# Patient Record
Sex: Male | Born: 1995 | Race: Black or African American | Hispanic: No | Marital: Single | Smoking: Never smoker
Health system: Southern US, Community
[De-identification: ages and names within clinical notes are randomized; demographics above are authoritative.]

## PROBLEM LIST (undated history)

## (undated) DIAGNOSIS — J45909 Unspecified asthma, uncomplicated: Secondary | ICD-10-CM

## (undated) DIAGNOSIS — R569 Unspecified convulsions: Secondary | ICD-10-CM

## (undated) HISTORY — PX: TONSILLECTOMY: SUR1361

## (undated) HISTORY — PX: APPENDECTOMY: SHX54

---
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
## 2015-04-09 ENCOUNTER — Emergency Department (HOSPITAL_BASED_OUTPATIENT_CLINIC_OR_DEPARTMENT_OTHER)
Admission: EM | Admit: 2015-04-09 | Discharge: 2015-04-09 | Disposition: A | Discharge status: Home / Self Care | Payer: Medicaid Other | Attending: Emergency Medicine | Admitting: Emergency Medicine

## 2015-04-09 ENCOUNTER — Encounter (HOSPITAL_BASED_OUTPATIENT_CLINIC_OR_DEPARTMENT_OTHER): Payer: Self-pay | Admitting: Emergency Medicine

## 2015-04-09 DIAGNOSIS — N489 Disorder of penis, unspecified: Secondary | ICD-10-CM

## 2015-04-09 DIAGNOSIS — N509 Disorder of male genital organs, unspecified: Secondary | ICD-10-CM | POA: Insufficient documentation

## 2015-04-09 DIAGNOSIS — N4821 Abscess of corpus cavernosum and penis: Secondary | ICD-10-CM | POA: Insufficient documentation

## 2015-04-09 DIAGNOSIS — L0291 Cutaneous abscess, unspecified: Secondary | ICD-10-CM

## 2015-04-09 DIAGNOSIS — J45909 Unspecified asthma, uncomplicated: Secondary | ICD-10-CM | POA: Insufficient documentation

## 2015-04-09 DIAGNOSIS — L02211 Cutaneous abscess of abdominal wall: Secondary | ICD-10-CM | POA: Insufficient documentation

## 2015-04-09 LAB — CBG MONITORING, ED: GLUCOSE-CAPILLARY: 67 mg/dL (ref 65–99)

## 2015-04-09 MED ORDER — SULFAMETHOXAZOLE-TRIMETHOPRIM 800-160 MG PO TABS: 1.0000 | ORAL_TABLET | Freq: Two times a day (BID) | ORAL | Status: AC

## 2015-04-09 NOTE — Discharge Instructions (Signed)
Abscess An abscess is an infected area that contains a collection of pus and debris.It can occur in almost any part of the body. An abscess is also known as a furuncle or boil. CAUSES  An abscess occurs when tissue gets infected. This can occur from blockage of oil or sweat glands, infection of hair follicles, or a minor injury to the skin. As the body tries to fight the infection, pus collects in the area and creates pressure under the skin. This pressure causes pain. People with weakened immune systems have difficulty fighting infections and get certain abscesses more often.  SYMPTOMS Usually an abscess develops on the skin and becomes a painful mass that is red, warm, and tender. If the abscess forms under the skin, you may feel a moveable soft area under the skin. Some abscesses break open (rupture) on their own, but most will continue to get worse without care. The infection can spread deeper into the body and eventually into the bloodstream, causing you to feel ill.  DIAGNOSIS  Your caregiver will take your medical history and perform a physical exam. A sample of fluid may also be taken from the abscess to determine what is causing your infection. TREATMENT  Your caregiver may prescribe antibiotic medicines to fight the infection. However, taking antibiotics alone usually does not cure an abscess. Your caregiver may need to make a small cut (incision) in the abscess to drain the pus. In some cases, gauze is packed into the abscess to reduce pain and to continue draining the area. HOME CARE INSTRUCTIONS   Only take over-the-counter or prescription medicines for pain, discomfort, or fever as directed by your caregiver.  If you were prescribed antibiotics, take them as directed. Finish them even if you start to feel better.  If gauze is used, follow your caregiver's directions for changing the gauze.  To avoid spreading the infection:  Keep your draining abscess covered with a  bandage.  Wash your hands well.  Do not share personal care items, towels, or whirlpools with others.  Avoid skin contact with others.  Keep your skin and clothes clean around the abscess.  Keep all follow-up appointments as directed by your caregiver. SEEK MEDICAL CARE IF:   You have increased pain, swelling, redness, fluid drainage, or bleeding.  You have muscle aches, chills, or a general ill feeling.  You have a fever. MAKE SURE YOU:   Understand these instructions.  Will watch your condition.  Will get help right away if you are not doing well or get worse.   This information is not intended to replace advice given to you by your health care provider. Make sure you discuss any questions you have with your health care provider.   Document Released: 10/02/2004 Document Revised: 06/24/2011 Document Reviewed: 03/07/2011 Elsevier Interactive Patient Education 2016 [employer]. Sexually Transmitted Disease Due to a lesion you have any penis, you were tested for herpes. You may follow up on these results in 3-4 days. No sexual activity until these results have returned. If you have any other symptoms suspicious for sexually transmitted disease, you will need other testing, if your results for herpes type 2 (genital herpes) are positive, you will also need other testing and treatment. Herpes type I is a common infection and does not mean you have a sexually transmitted disease. Herpes type one does not need any specific treatment. A sexually transmitted disease (STD) is a disease or infection that may be passed (transmitted) from person to person, usually  during sexual activity. This may happen by way of saliva, semen, blood, vaginal mucus, or urine. Common STDs include:  Gonorrhea.  Chlamydia.  Syphilis.  HIV and AIDS.  Genital herpes.  Hepatitis B and C.  Trichomonas.  Human papillomavirus (HPV).  Pubic lice.  Scabies.  Mites.  Bacterial vaginosis. WHAT ARE  CAUSES OF STDs? An STD may be caused by bacteria, a virus, or parasites. STDs are often transmitted during sexual activity if one person is infected. However, they may also be transmitted through nonsexual means. STDs may be transmitted after:   Sexual intercourse with an infected person.  Sharing sex toys with an infected person.  Sharing needles with an infected person or using unclean piercing or tattoo needles.  Having intimate contact with the genitals, mouth, or rectal areas of an infected person.  Exposure to infected fluids during birth. WHAT ARE THE SIGNS AND SYMPTOMS OF STDs? Different STDs have different symptoms. Some people may not have any symptoms. If symptoms are present, they may include:  Painful or bloody urination.  Pain in the pelvis, abdomen, vagina, anus, throat, or eyes.  A skin rash, itching, or irritation.  Growths, ulcerations, blisters, or sores in the genital and anal areas.  Abnormal vaginal discharge with or without bad odor.  Penile discharge in men.  Fever.  Pain or bleeding during sexual intercourse.  Swollen glands in the groin area.  Yellow skin and eyes (jaundice). This is seen with hepatitis.  Swollen testicles.  Infertility.  Sores and blisters in the mouth. HOW ARE STDs DIAGNOSED? To make a diagnosis, your health care provider may:  Take a medical history.  Perform a physical exam.  Take a sample of any discharge to examine.  Swab the throat, cervix, opening to the penis, rectum, or vagina for testing.  Test a sample of your first morning urine.  Perform blood tests.  Perform a Pap test, if this applies.  Perform a colposcopy.  Perform a laparoscopy. HOW ARE STDs TREATED? Treatment depends on the STD. Some STDs may be treated but not cured.  Chlamydia, gonorrhea, trichomonas, and syphilis can be cured with antibiotic medicine.  Genital herpes, hepatitis, and HIV can be treated, but not cured, with prescribed  medicines. The medicines lessen symptoms.  Genital warts from HPV can be treated with medicine or by freezing, burning (electrocautery), or surgery. Warts may come back.  HPV cannot be cured with medicine or surgery. However, abnormal areas may be removed from the cervix, vagina, or vulva.  If your diagnosis is confirmed, your recent sexual partners need treatment. This is true even if they are symptom-free or have a negative culture or evaluation. They should not have sex until their health care providers say it is okay.  Your health care provider may test you for infection again 3 months after treatment. HOW CAN I REDUCE MY RISK OF GETTING AN STD? Take these steps to reduce your risk of getting an STD:  Use latex condoms, dental dams, and water-soluble lubricants during sexual activity. Do not use petroleum jelly or oils.  Avoid having multiple sex partners.  Do not have sex with someone who has other sex partners  Do not have sex with anyone you do not know or who is at high risk for an STD.  Avoid risky sex practices that can break your skin.  Do not have sex if you have open sores on your mouth or skin.  Avoid drinking too much alcohol or taking illegal drugs. Alcohol and  drugs can affect your judgment and put you in a vulnerable position.  Avoid engaging in oral and anal sex acts.  Get vaccinated for HPV and hepatitis. If you have not received these vaccines in the past, talk to your health care provider about whether one or both might be right for you.  If you are at risk of being infected with HIV, it is recommended that you take a prescription medicine daily to prevent HIV infection. This is called pre-exposure prophylaxis (PrEP). You are considered at risk if:  You are a man who has sex with other men (MSM).  You are a heterosexual man or woman and are sexually active with more than one partner.  You take drugs by injection.  You are sexually active with a partner who  has HIV.  Talk with your health care provider about whether you are at high risk of being infected with HIV. If you choose to begin PrEP, you should first be tested for HIV. You should then be tested every 3 months for as long as you are taking PrEP. WHAT SHOULD I DO IF I THINK I HAVE AN STD?  See your health care provider.  Tell your sexual partner(s). They should be tested and treated for any STDs.  Do not have sex until your health care provider says it is okay. WHEN SHOULD I GET IMMEDIATE MEDICAL CARE? Contact your health care provider right away if:   You have severe abdominal pain.  You are a man and notice swelling or pain in your testicles.  You are a woman and notice swelling or pain in your vagina.   This information is not intended to replace advice given to you by your health care provider. Make sure you discuss any questions you have with your health care provider.   Document Released: 03/15/2002 Document Revised: 01/13/2014 Document Reviewed: 07/13/2012 Elsevier Interactive Patient Education [employer]2016 Elsevier Inc.

## 2015-04-09 NOTE — ED Notes (Signed)
Patient states that he has 2 areas of swelling and redness. The patient reports that it he tried to pop the one on his chest with a pin yesterday. The patient in no distress at this time.

## 2015-04-09 NOTE — ED Provider Notes (Signed)
CSN: [id number]     Arrival date & time 04/09/15  1603 History  By signing my name below, I, Linus GalasMaharshi Patel, attest that this documentation has been prepared under the direction and in the presence of No att. providers found. Electronically Signed: Linus GalasMaharshi Patel, ED Scribe. 04/10/2015. 12:04 AM.   Chief Complaint  Patient presents with  . Recurrent Skin Infections   The history is provided by the patient. No language interpreter was used.    HPI Comments: Levi Jimenez is a 20 y.o. male who presents to the Emergency Department complaining of abscess that was noted 2 days ago. He localizes one abscess to the left abdomen and the other on his penis. He tried to pop the abscess on his abdomen using a pin. He did not have any drainage. Pt denies any fevers, chills, N/V or any other symptoms at this time. NKDA  Past Medical History  Diagnosis Date  . Asthma   . Seizures [facility](H[facility])    Past Surgical History  Procedure Laterality Date  . Tonsillectomy    . Appendectomy     History reviewed. No pertinent family history. Social History  Substance Use Topics  . Smoking status: Never Smoker   . Smokeless tobacco: None  . Alcohol Use: No    Review of Systems  Constitutional: Negative for fever and chills.  Gastrointestinal: Negative for nausea and vomiting.  All other systems reviewed and are negative.  Allergies  Peanut butter flavor and Soy allergy  Home Medications   Prior to Admission medications   Medication Sig Start Date End Date Taking? Authorizing Provider  acetaminophen (TYLENOL) 325 MG tablet Take by mouth every 6 (six) hours as needed. Patient used this medication for his headache.    Historical Provider, MD  albuterol (PROVENTIL HFA;VENTOLIN HFA) 108 (90 BASE) MCG/ACT inhaler Inhale 2 puffs into the lungs every 6 (six) hours as needed. Patient uses this medication for shortness of breath.    Historical Provider, MD  albuterol (PROVENTIL) (2.5 MG/3ML) 0.083% nebulizer  solution Take 2.5 mg by nebulization every 6 (six) hours as needed.    Historical Provider, MD  sulfamethoxazole-trimethoprim (BACTRIM DS,SEPTRA DS) 800-160 MG tablet Take 1 tablet by mouth 2 (two) times daily. 04/09/15 04/16/15  Arby BarretteMarcy Ashlie Mcmenamy, MD   BP 132/76 mmHg  Pulse 79  Temp(Src) 98.6 F (37 C) (Oral)  Resp 18  Ht 5\' 11"  (1.803 m)  Wt 220 lb (99.791 kg)  BMI 30.70 kg/m2  SpO2 98%   Physical Exam  Constitutional: He is oriented to person, place, and time. He appears well-developed and well-nourished.  HENT:  Head: Normocephalic and atraumatic.  Neck: Normal range of motion. Neck supple.  Cardiovascular: Normal rate, regular rhythm, normal heart sounds and intact distal pulses.  Exam reveals no gallop and no friction rub.   No murmur heard. Pulmonary/Chest: Effort normal and breath sounds normal.  Abdominal: Soft. He exhibits no distension. There is no tenderness.  Musculoskeletal: Normal range of motion. He exhibits no edema or tenderness.  Neurological: He is alert and oriented to person, place, and time.  Skin: Skin is warm and dry.  1.5 cm nodule indurated left abdominal well will central eschar on top of it, pale and yellow; no fluctuance 1 cm induration on penis, eschar on top of it, no fluctuance   Psychiatric: He has a normal mood and affect.  Nursing note and vitals reviewed.   ED Course  Procedures   DIAGNOSTIC STUDIES: Oxygen Saturation is 100% on room air, normal  by my interpretation.    COORDINATION OF CARE: 5:41 PM Will order herpes screen and CBG. Discussed treatment plan with pt and mother at bedside and they agreed to plan.  Labs Review Labs Reviewed  HSV(HERPES SMPLX)ABS-I+II(IGG+IGM)-BLD  CBG MONITORING, ED    Imaging Review No results found. I have personally reviewed and evaluated these images and lab results as part of my medical decision-making.   EKG Interpretation None      MDM   Final diagnoses:  Abscess  Penile lesion   Patient  has a indurated abscess on the abdominal wall. This is uncomplicated in appearance. There is surrounding erythema but no fluctuance. At this time patient will be placed on antibiotics, Bactrim, with recommendations for warm compresses and observation. Signs and symptoms for return are reviewed. Patient also has a penile lesion at the base of the penis that is indurated also with a dry eschar on top. No General edema or erythema of the penis. The lesion is not clearly on the hairbearing area to suggest follicular abscess. Consideration is given to an HSV lesion thus labs have been drawn to differentiate possible HSV lesion from early abscess.   Arby Barrette, MD 04/10/15 [phone]

## 2015-04-10 LAB — HSV(HERPES SMPLX)ABS-I+II(IGG+IGM)-BLD
HSV 1 Glycoprotein G Ab, IgG: 31.7 index — ABNORMAL HIGH (ref 0.00–0.90)
HSVI/II Comb IgM: 1.01 Ratio — ABNORMAL HIGH (ref 0.00–0.90)

## 2015-04-12 ENCOUNTER — Telehealth (HOSPITAL_BASED_OUTPATIENT_CLINIC_OR_DEPARTMENT_OTHER): Payer: Self-pay | Admitting: Emergency Medicine

## 2015-04-12 NOTE — Telephone Encounter (Signed)
Chart handoff to EDP for treatment plan for HSV, patient has been informed

## 2015-04-15 ENCOUNTER — Telehealth: Payer: Self-pay

## 2015-05-13 ENCOUNTER — Telehealth (HOSPITAL_BASED_OUTPATIENT_CLINIC_OR_DEPARTMENT_OTHER): Payer: Self-pay

## 2016-06-06 ENCOUNTER — Emergency Department (HOSPITAL_BASED_OUTPATIENT_CLINIC_OR_DEPARTMENT_OTHER): Payer: Self-pay

## 2016-06-06 ENCOUNTER — Encounter (HOSPITAL_BASED_OUTPATIENT_CLINIC_OR_DEPARTMENT_OTHER): Payer: Self-pay | Admitting: *Deleted

## 2016-06-06 ENCOUNTER — Emergency Department (HOSPITAL_BASED_OUTPATIENT_CLINIC_OR_DEPARTMENT_OTHER)
Admission: EM | Admit: 2016-06-06 | Discharge: 2016-06-06 | Disposition: A | Discharge status: Home / Self Care | Payer: Self-pay | Attending: Emergency Medicine | Admitting: Emergency Medicine

## 2016-06-06 DIAGNOSIS — L0231 Cutaneous abscess of buttock: Secondary | ICD-10-CM | POA: Insufficient documentation

## 2016-06-06 DIAGNOSIS — J45909 Unspecified asthma, uncomplicated: Secondary | ICD-10-CM | POA: Insufficient documentation

## 2016-06-06 DIAGNOSIS — Z79899 Other long term (current) drug therapy: Secondary | ICD-10-CM | POA: Insufficient documentation

## 2016-06-06 MED ORDER — LIDOCAINE-EPINEPHRINE (PF) 2 %-1:200000 IJ SOLN
10.0000 mL | Freq: Once | INTRAMUSCULAR | Status: AC
  Administered 2016-06-06: 10 mL

## 2016-06-06 MED ORDER — IBUPROFEN 200 MG PO TABS
600.0000 mg | ORAL_TABLET | Freq: Once | ORAL | Status: AC
  Administered 2016-06-06: 600 mg via ORAL
  Filled 2016-06-06: qty 1

## 2016-06-06 MED ORDER — SULFAMETHOXAZOLE-TRIMETHOPRIM 800-160 MG PO TABS: 1.0000 | ORAL_TABLET | Freq: Two times a day (BID) | ORAL | 0 refills | Status: AC

## 2016-06-06 MED ORDER — LIDOCAINE-EPINEPHRINE (PF) 2 %-1:200000 IJ SOLN
10.0000 mL | Freq: Once | INTRAMUSCULAR | Status: AC
  Administered 2016-06-06: 10 mL
  Filled 2016-06-06: qty 10

## 2016-06-06 MED ORDER — LIDOCAINE-EPINEPHRINE (PF) 2 %-1:200000 IJ SOLN
INTRAMUSCULAR | Status: AC
  Filled 2016-06-06: qty 10

## 2016-06-06 MED ORDER — NAPROXEN 500 MG PO TABS: 500.0000 mg | ORAL_TABLET | Freq: Two times a day (BID) | ORAL | 0 refills | Status: DC

## 2016-06-06 MED FILL — NAPROXEN 500 MG TABLET: 500 | 7 days supply | Qty: 15 | Fill #0

## 2016-06-06 MED FILL — SULFAMETHOXAZOLE/TMP DS TAB: 800-160 | 7 days supply | Qty: 14 | Fill #0

## 2016-06-06 NOTE — ED Notes (Signed)
ED Provider at bedside. 

## 2016-06-06 NOTE — Discharge Instructions (Signed)
Take antibiotics, soak twice daily.  If you were given medicines take as directed.  If you are on coumadin or contraceptives realize their levels and effectiveness is altered by many different medicines.  If you have any reaction (rash, tongues swelling, other) to the medicines stop taking and see a physician.    If your blood pressure was elevated in the ER make sure you follow up for management with a primary doctor or return for chest pain, shortness of breath or stroke symptoms.  Please follow up as directed and return to the ER or see a physician for new or worsening symptoms.  Thank you. Vitals:   06/06/16 1356 06/06/16 1357  BP:  129/78  Pulse:  64  Resp:  18  Temp:  99.3 F (37.4 C)  TempSrc:  Oral  SpO2:  100%  Weight: 102.1 kg (225 lb)   Height: 5\' 11"  (1.803 m)

## 2016-06-06 NOTE — ED Triage Notes (Signed)
Abscess on right buttock x 1 week.  Hx of same

## 2016-06-06 NOTE — ED Notes (Signed)
I&D tray at bedside.

## 2016-06-06 NOTE — ED Provider Notes (Signed)
[facility] Provider Note   CSN: [id number] Arrival date & time: 06/06/16  1349     History   Chief Complaint Chief Complaint  Patient presents with  . Abscess    HPI Levi Jimenez is a 21 y.o. male.  Patient with history of cutaneous abscess presents with worsening abscess in the right by Dr. for the past week. No fevers or chills. No diabetes history. Patient's had mild drainage from it.      Past Medical History:  Diagnosis Date  . Asthma   . Seizures (HCC)     There are no active problems to display for this patient.   Past Surgical History:  Procedure Laterality Date  . APPENDECTOMY    . TONSILLECTOMY         Home Medications    Prior to Admission medications   Medication Sig Start Date End Date Taking? Authorizing Provider  acetaminophen (TYLENOL) 325 MG tablet Take by mouth every 6 (six) hours as needed. Patient used this medication for his headache.    [provider]  albuterol (PROVENTIL HFA;VENTOLIN HFA) 108 (90 BASE) MCG/ACT inhaler Inhale 2 puffs into the lungs every 6 (six) hours as needed. Patient uses this medication for shortness of breath.    [provider]  albuterol (PROVENTIL) (2.5 MG/3ML) 0.083% nebulizer solution Take 2.5 mg by nebulization every 6 (six) hours as needed.    [provider]  naproxen (NAPROSYN) 500 MG tablet Take 1 tablet (500 mg total) by mouth 2 (two) times daily. 06/06/16   Blane Ohara, MD  sulfamethoxazole-trimethoprim (BACTRIM DS,SEPTRA DS) 800-160 MG tablet Take 1 tablet by mouth 2 (two) times daily. 06/06/16 06/13/16  Blane Ohara, MD    Family History History reviewed. No pertinent family history.  Social History Social History  Substance Use Topics  . Smoking status: Never Smoker  . Smokeless tobacco: Not on file  . Alcohol use No     Allergies   Peanut butter flavor and Soy allergy   Review of Systems Review of Systems  Constitutional: Negative for fever.    Respiratory: Negative for cough.   Skin: Positive for rash and wound.  Neurological: Negative for headaches.     Physical Exam Updated Vital Signs BP 129/78 (BP Location: Left Arm)   Pulse 64   Temp 99.3 F (37.4 C) (Oral)   Resp 18   Ht 5\' 11"  (1.803 m)   Wt 102.1 kg (225 lb)   SpO2 100%   BMI 31.38 kg/m   Physical Exam  Constitutional: He appears well-developed and well-nourished. No distress.  Pulmonary/Chest: Effort normal.  Neurological: He is alert.  Skin: Skin is warm.  Patient has approximately 3 cm x 3 cm induration with central opening and mild purulent drainage to the right mid buttocks. Mild warmth. No crepitus.  Psychiatric: He has a normal mood and affect.  Nursing note and vitals reviewed.    ED Treatments / Results  Labs (all labs ordered are listed, but only abnormal results are displayed) Labs Reviewed - No data to display  EKG  EKG Interpretation None       Radiology No results found.  Procedures Procedures (including critical care time) INCISION AND DRAINAGE Performed by: Enid Skeens Consent: Verbal consent obtained. Risks and benefits: risks, benefits and alternatives were discussed Type: abscess  Body area: buttocks Anesthesia: local infiltration Incision was made with a scalpel. Local anesthetic: lidocaine Anesthetic total: 20 ml Complexity: complex Blunt dissection to break up loculations Drainage:  4 cc  Patient tolerance: Patient tolerated the procedure well with no immediate complications.    Medications Ordered in ED Medications  ibuprofen (ADVIL,MOTRIN) tablet 600 mg (not administered)  lidocaine-EPINEPHrine (XYLOCAINE W/EPI) 2 %-1:200000 (PF) injection 10 mL (10 mLs Infiltration Given 06/06/16 1501)  lidocaine-EPINEPHrine (XYLOCAINE W/EPI) 2 %-1:200000 (PF) injection 10 mL (10 mLs Infiltration Given 06/06/16 1508)     Initial Impression / Assessment and Plan / ED Course  I have reviewed the triage vital signs  and the nursing notes.  Pertinent labs & imaging results that were available during my care of the patient were reviewed by me and considered in my medical decision making (see chart for details).    Well-appearing patient presents with clinically abscess. Incision and drainage performed in the ER. Patient tolerated well. Discussed antibody X and outpatient follow.  Results and differential diagnosis were discussed with the patient/parent/guardian. Xrays were independently reviewed by myself.  Close follow up outpatient was discussed, comfortable with the plan.   Medications  ibuprofen (ADVIL,MOTRIN) tablet 600 mg (not administered)  lidocaine-EPINEPHrine (XYLOCAINE W/EPI) 2 %-1:200000 (PF) injection 10 mL (10 mLs Infiltration Given 06/06/16 1501)  lidocaine-EPINEPHrine (XYLOCAINE W/EPI) 2 %-1:200000 (PF) injection 10 mL (10 mLs Infiltration Given 06/06/16 1508)    Vitals:   06/06/16 1356 06/06/16 1357  BP:  129/78  Pulse:  64  Resp:  18  Temp:  99.3 F (37.4 C)  TempSrc:  Oral  SpO2:  100%  Weight: 102.1 kg (225 lb)   Height: 5\' 11"  (1.803 m)     Final diagnoses:  Cutaneous abscess of buttock     Final Clinical Impressions(s) / ED Diagnoses   Final diagnoses:  Cutaneous abscess of buttock    New Prescriptions New Prescriptions   NAPROXEN (NAPROSYN) 500 MG TABLET    Take 1 tablet (500 mg total) by mouth 2 (two) times daily.   SULFAMETHOXAZOLE-TRIMETHOPRIM (BACTRIM DS,SEPTRA DS) 800-160 MG TABLET    Take 1 tablet by mouth 2 (two) times daily.     Blane OharaZavitz, Cartier Mapel, MD 06/06/16 [phone]

## 2016-06-06 NOTE — ED Notes (Signed)
Pt directed to pharmacy to pick up Rx 

## 2016-06-06 NOTE — ED Notes (Signed)
Patient c/o abscess to right buttocks, reports hx of same. States this abscess has been present x1 week. Patient states last night the area opened on its own., reports green-bloody discharge. Patient requesting the site to be drained. Patient denies taking anything for pain PTA.

## 2017-07-12 ENCOUNTER — Encounter (HOSPITAL_BASED_OUTPATIENT_CLINIC_OR_DEPARTMENT_OTHER): Payer: Self-pay | Admitting: Emergency Medicine

## 2017-07-12 ENCOUNTER — Other Ambulatory Visit: Payer: Self-pay

## 2017-07-12 ENCOUNTER — Emergency Department (HOSPITAL_BASED_OUTPATIENT_CLINIC_OR_DEPARTMENT_OTHER)
Admission: EM | Admit: 2017-07-12 | Discharge: 2017-07-12 | Disposition: A | Discharge status: Home / Self Care | Payer: Self-pay | Attending: Emergency Medicine | Admitting: Emergency Medicine

## 2017-07-12 DIAGNOSIS — J45909 Unspecified asthma, uncomplicated: Secondary | ICD-10-CM | POA: Insufficient documentation

## 2017-07-12 DIAGNOSIS — L02415 Cutaneous abscess of right lower limb: Secondary | ICD-10-CM | POA: Insufficient documentation

## 2017-07-12 DIAGNOSIS — Z79899 Other long term (current) drug therapy: Secondary | ICD-10-CM | POA: Insufficient documentation

## 2017-07-12 DIAGNOSIS — L0291 Cutaneous abscess, unspecified: Secondary | ICD-10-CM

## 2017-07-12 DIAGNOSIS — Z91010 Allergy to peanuts: Secondary | ICD-10-CM | POA: Insufficient documentation

## 2017-07-12 MED ORDER — CLINDAMYCIN HCL 150 MG PO CAPS: 450.0000 mg | ORAL_CAPSULE | Freq: Three times a day (TID) | ORAL | 0 refills | Status: DC

## 2017-07-12 MED ORDER — LIDOCAINE HCL (PF) 1 % IJ SOLN
5.0000 mL | Freq: Once | INTRAMUSCULAR | Status: AC
Start: 2017-07-12 — End: 2017-07-12
  Administered 2017-07-12: 5 mL
  Filled 2017-07-12: qty 5

## 2017-07-12 MED ORDER — CEPHALEXIN 500 MG PO CAPS: 500.0000 mg | ORAL_CAPSULE | Freq: Four times a day (QID) | ORAL | 0 refills | Status: AC

## 2017-07-12 MED ORDER — CLINDAMYCIN HCL 150 MG PO CAPS
450.0000 mg | ORAL_CAPSULE | Freq: Once | ORAL | Status: DC
  Filled 2017-07-12: qty 3

## 2017-07-12 MED ORDER — SULFAMETHOXAZOLE-TRIMETHOPRIM 800-160 MG PO TABS: 1.0000 | ORAL_TABLET | Freq: Two times a day (BID) | ORAL | 0 refills | Status: AC

## 2017-07-12 MED ORDER — NAPROXEN 250 MG PO TABS
500.0000 mg | ORAL_TABLET | Freq: Once | ORAL | Status: DC
  Filled 2017-07-12: qty 2

## 2017-07-12 MED ORDER — NAPROXEN 500 MG PO TABS: 500.0000 mg | ORAL_TABLET | Freq: Two times a day (BID) | ORAL | 0 refills | Status: AC

## 2017-07-12 MED ORDER — PROBIOTIC 250 MG PO CAPS: 1.0000 | ORAL_CAPSULE | Freq: Every day | ORAL | 0 refills | Status: DC

## 2017-07-12 MED ORDER — NAPROXEN 500 MG PO TABS: 500.0000 mg | ORAL_TABLET | Freq: Two times a day (BID) | ORAL | 0 refills | Status: DC

## 2017-07-12 NOTE — ED Triage Notes (Signed)
Patient states that he has had an area of swelling to his right outer upper leg - the patient reports that he thinks he was bit by a bug - patient reports that it hurts when he walks on it  - Patient reports that he has had this for the last "few days"

## 2017-07-12 NOTE — Discharge Instructions (Addendum)
Medications: Bactrim, Keflex, Naprosyn  Treatment: Take Bactrim and Keflex until completed.  Take Naprosyn twice daily as needed for your pain.  Wash wound with warm soapy water and keep clean and covered.  Change dressing daily.  Follow-up: Please return to emergency department in 2 days for wound check.  Please return sooner if you develop any increasing pain, redness, swelling, red streaking from the wound, or fevers.

## 2017-07-12 NOTE — ED Provider Notes (Signed)
[facility] EMERGENCY DEPARTMENT Provider Note   CSN: [id number] Arrival date & time: 07/12/17  1138     History   Chief Complaint Chief Complaint  Patient presents with  . Cellulitis    HPI Levi Jimenez is a 22 y.o. male history of seizures, asthma who presents with a few day history of swelling, pain, and drainage to his right posterior thigh.  He reports he might have been bitten by a spider.  Symptoms are worse with pressure around the wound.  He has mild pain when he walks.  He denies any fevers.  He has had drainage.  He reports history of abscess on his buttock that had to be drained.  He denies any chest pain, shortness of breath, abdominal pain, nausea, vomiting.  HPI  Past Medical History:  Diagnosis Date  . Asthma   . Seizures (HCC)     There are no active problems to display for this patient.   Past Surgical History:  Procedure Laterality Date  . APPENDECTOMY    . TONSILLECTOMY          Home Medications    Prior to Admission medications   Medication Sig Start Date End Date Taking? Authorizing Provider  acetaminophen (TYLENOL) 325 MG tablet Take by mouth every 6 (six) hours as needed. Patient used this medication for his headache.    [provider]  albuterol (PROVENTIL HFA;VENTOLIN HFA) 108 (90 BASE) MCG/ACT inhaler Inhale 2 puffs into the lungs every 6 (six) hours as needed. Patient uses this medication for shortness of breath.    [provider]  albuterol (PROVENTIL) (2.5 MG/3ML) 0.083% nebulizer solution Take 2.5 mg by nebulization every 6 (six) hours as needed.    [provider]  cephALEXin (KEFLEX) 500 MG capsule Take 1 capsule (500 mg total) by mouth 4 (four) times daily. 07/12/17   Shamara Soza, Waylan Boga, PA-C  naproxen (NAPROSYN) 500 MG tablet Take 1 tablet (500 mg total) by mouth 2 (two) times daily. 07/12/17   Datron Brakebill, Waylan Boga, PA-C  sulfamethoxazole-trimethoprim (BACTRIM DS,SEPTRA DS) 800-160 MG tablet Take 1  tablet by mouth 2 (two) times daily for 7 days. 07/12/17 07/19/17  Emi Holes, PA-C    Family History History reviewed. No pertinent family history.  Social History Social History   Tobacco Use  . Smoking status: Never Smoker  . Smokeless tobacco: Never Used  Substance Use Topics  . Alcohol use: No  . Drug use: No     Allergies   Peanut butter flavor and Soy allergy   Review of Systems Review of Systems  Constitutional: Negative for chills and fever.  HENT: Negative for facial swelling.   Respiratory: Negative for shortness of breath.   Cardiovascular: Negative for chest pain.  Gastrointestinal: Negative for abdominal pain, nausea and vomiting.  Skin: Positive for wound. Negative for rash.  Psychiatric/Behavioral: The patient is not nervous/anxious.      Physical Exam Updated Vital Signs BP 128/79 (BP Location: Left Arm)   Pulse 66   Temp 98.9 F (37.2 C) (Oral)   Resp 18   Ht 5\' 10"  (1.778 m)   Wt 99.8 kg (220 lb)   SpO2 99%   BMI 31.57 kg/m   Physical Exam  Constitutional: He appears well-developed and well-nourished. No distress.  HENT:  Head: Normocephalic and atraumatic.  Mouth/Throat: Oropharynx is clear and moist. No oropharyngeal exudate.  Eyes: Pupils are equal, round, and reactive to light. Conjunctivae are normal. Right eye exhibits no discharge. Left  eye exhibits no discharge. No scleral icterus.  Neck: Normal range of motion. Neck supple. No thyromegaly present.  Cardiovascular: Normal rate, regular rhythm, normal heart sounds and intact distal pulses. Exam reveals no gallop and no friction rub.  No murmur heard. Pulmonary/Chest: Effort normal and breath sounds normal. No stridor. No respiratory distress. He has no wheezes. He has no rales.  Abdominal: Soft. Bowel sounds are normal. He exhibits no distension. There is no tenderness. There is no rebound and no guarding.  Musculoskeletal: He exhibits no edema.  Lymphadenopathy:    He has no  cervical adenopathy.  Neurological: He is alert. Coordination normal.  Skin: Skin is warm and dry. No rash noted. He is not diaphoretic. No pallor.  Abscess to right posterior thigh with few small openings with purulent bloody drainage, 8 to 9 cm circumferential area of surrounding induration and tenderness, mild erythema  Psychiatric: He has a normal mood and affect.  Nursing note and vitals reviewed.    ED Treatments / Results  Labs (all labs ordered are listed, but only abnormal results are displayed) Labs Reviewed - No data to display  EKG None  Radiology No results found.  Procedures .Marland Kitchen.Incision and Drainage Date/Time: 07/12/2017 3:19 PM Performed by: Emi HolesLaw, Raiya Stainback M, PA-C Authorized by: Emi HolesLaw, Marcellius Montagna M, PA-C   Consent:    Consent obtained:  Verbal   Consent given by:  Patient   Risks discussed:  Bleeding, incomplete drainage, pain and infection Location:    Type:  Abscess   Size:  8-9 cm induration, unable to assess cavity size with no access to US today   Location:  Lower extremity   Lower extremity location:  Leg   Leg location:  R upper leg Pre-procedure details:    Skin preparation:  Betadine Anesthesia (see MAR for exact dosages):    Anesthesia method:  Local infiltration   Local anesthetic:  Lidocaine 1% w/o epi Procedure type:    Complexity:  Simple Procedure details:    Needle aspiration: no     Incision types:  Single straight   Incision depth:  Dermal   Scalpel blade:  11   Wound management:  Probed and deloculated, irrigated with saline and extensive cleaning   Drainage:  Bloody and purulent   Drainage amount:  Copious   Wound treatment:  Wound left open   Packing materials:  None Post-procedure details:    Patient tolerance of procedure:  Tolerated well, no immediate complications   (including critical care time)  Medications Ordered in ED Medications  lidocaine (PF) (XYLOCAINE) 1 % injection 5 mL (5 mLs Infiltration Given 07/12/17 1250)      Initial Impression / Assessment and Plan / ED Course  I have reviewed the triage vital signs and the nursing notes.  Pertinent labs & imaging results that were available during my care of the patient were reviewed by me and considered in my medical decision making (see chart for details).     Patient with skin abscess. Incision and drainage performed in the ED today.  I extended the already draining wound to allow more complete drainage.  Abscess was not large enough to warrant packing or drain placement. Wound recheck in 2 days. Supportive care and return precautions discussed.  Pt sent home with Bactrim, Keflex, Naprosyn for pain.  Patient to follow-up in 2 days for wound recheck.  Patient understands and agrees with plan.  Patient vitals stable throughout ED course and discharged in satisfactory condition.    Final Clinical  Impressions(s) / ED Diagnoses   Final diagnoses:  Abscess    ED Discharge Orders        Ordered    clindamycin (CLEOCIN) 150 MG capsule  3 times daily,   Status:  Discontinued     07/12/17 1241    naproxen (NAPROSYN) 500 MG tablet  2 times daily,   Status:  Discontinued     07/12/17 1241    Saccharomyces boulardii (PROBIOTIC) 250 MG CAPS  Daily,   Status:  Discontinued     07/12/17 1241    sulfamethoxazole-trimethoprim (BACTRIM DS,SEPTRA DS) 800-160 MG tablet  2 times daily     07/12/17 1346    cephALEXin (KEFLEX) 500 MG capsule  4 times daily     07/12/17 1346    naproxen (NAPROSYN) 500 MG tablet  2 times daily     07/12/17 [address] [place], PA-C 07/12/17 1521    Benjiman Core, MD 07/12/17 1535

## 2020-02-17 ENCOUNTER — Other Ambulatory Visit (HOSPITAL_COMMUNITY): Payer: Self-pay | Admitting: Emergency Medicine

## 2020-02-17 ENCOUNTER — Encounter (HOSPITAL_BASED_OUTPATIENT_CLINIC_OR_DEPARTMENT_OTHER): Payer: Self-pay | Admitting: Emergency Medicine

## 2020-02-17 ENCOUNTER — Emergency Department (HOSPITAL_BASED_OUTPATIENT_CLINIC_OR_DEPARTMENT_OTHER)
Admission: EM | Admit: 2020-02-17 | Discharge: 2020-02-17 | Disposition: A | Discharge status: Home / Self Care | Payer: Self-pay | Attending: Emergency Medicine | Admitting: Emergency Medicine

## 2020-02-17 ENCOUNTER — Other Ambulatory Visit: Payer: Self-pay

## 2020-02-17 DIAGNOSIS — Z202 Contact with and (suspected) exposure to infections with a predominantly sexual mode of transmission: Secondary | ICD-10-CM | POA: Insufficient documentation

## 2020-02-17 DIAGNOSIS — J45909 Unspecified asthma, uncomplicated: Secondary | ICD-10-CM | POA: Insufficient documentation

## 2020-02-17 DIAGNOSIS — R300 Dysuria: Secondary | ICD-10-CM | POA: Insufficient documentation

## 2020-02-17 LAB — URINALYSIS, ROUTINE W REFLEX MICROSCOPIC
Bilirubin Urine: NEGATIVE
Glucose, UA: NEGATIVE mg/dL
Ketones, ur: NEGATIVE mg/dL
Nitrite: NEGATIVE
Protein, ur: NEGATIVE mg/dL
Specific Gravity, Urine: 1.015 (ref 1.005–1.030)
pH: 7.5 (ref 5.0–8.0)

## 2020-02-17 LAB — URINALYSIS, MICROSCOPIC (REFLEX)

## 2020-02-17 LAB — HIV ANTIBODY (ROUTINE TESTING W REFLEX): HIV Screen 4th Generation wRfx: NONREACTIVE

## 2020-02-17 MED ORDER — CEPHALEXIN 500 MG PO CAPS: 500.0000 mg | ORAL_CAPSULE | Freq: Four times a day (QID) | ORAL | 0 refills | Status: AC

## 2020-02-17 MED ORDER — LIDOCAINE HCL (PF) 1 % IJ SOLN
1.0000 mL | Freq: Once | INTRAMUSCULAR | Status: AC
Start: 2020-02-17 — End: 2020-02-17
  Administered 2020-02-17: 1 mL
  Filled 2020-02-17: qty 5

## 2020-02-17 MED ORDER — CEFTRIAXONE SODIUM 500 MG IJ SOLR
500.0000 mg | Freq: Once | INTRAMUSCULAR | Status: AC
  Administered 2020-02-17: 500 mg via INTRAMUSCULAR
  Filled 2020-02-17: qty 500

## 2020-02-17 MED ORDER — DOXYCYCLINE HYCLATE 100 MG PO CAPS
100.0000 mg | ORAL_CAPSULE | Freq: Two times a day (BID) | ORAL | 0 refills | Status: AC
Start: 2020-02-17 — End: 2020-02-24

## 2020-02-17 MED ORDER — METRONIDAZOLE 500 MG PO TABS
2000.0000 mg | ORAL_TABLET | Freq: Once | ORAL | Status: AC
  Administered 2020-02-17: 2000 mg via ORAL
  Filled 2020-02-17: qty 4

## 2020-02-17 MED FILL — CEPHALEXIN 500 MG CAPSULE: 500 | 5 days supply | Qty: 20 | Fill #0

## 2020-02-17 MED FILL — DOXYCYCLINE HYCLATE 100 MG: 100 | 7 days supply | Qty: 14 | Fill #0

## 2020-02-17 NOTE — ED Notes (Signed)
Patient verbalized understanding of dc instructions, prescriptions, follow up referrals and reasons to return to ER for reevaluation.  

## 2020-02-17 NOTE — ED Triage Notes (Signed)
Pt had exposure to stds.  Girlfriend has gonorrhea, syphilis and trichomonas.  Pt having burning urination and drainage since yesterday.

## 2020-02-17 NOTE — ED Provider Notes (Signed)
[facility] EMERGENCY DEPARTMENT Provider Note   CSN: [id number] Arrival date & time: 02/17/20  1156     History Chief Complaint  Patient presents with  . Exposure to STD    Levi Jimenez is a 25 y.o. male.  Presents to ER with concern for STD exposure.  Patient reports that his girlfriend recently notified him that she had gonorrhea syphilis and trichomonas.  Yesterday patient noted having some mild burning with urination as well as occasional penile discharge.  Discharge is slightly yellow.  He denies any other symptoms, no pain, ulcers in his genital region.  Thinks that he had an STD many years ago.  States he only has 1 sexual partner, male, does not use protection.  HPI     Past Medical History:  Diagnosis Date  . Asthma   . Seizures (HCC)     There are no problems to display for this patient.   Past Surgical History:  Procedure Laterality Date  . APPENDECTOMY    . TONSILLECTOMY         No family history on file.  Social History   Tobacco Use  . Smoking status: Never Smoker  . Smokeless tobacco: Never Used  Substance Use Topics  . Alcohol use: No  . Drug use: No    Home Medications Prior to Admission medications   Medication Sig Start Date End Date Taking? Authorizing Provider  cephALEXin (KEFLEX) 500 MG capsule Take 1 capsule (500 mg total) by mouth 4 (four) times daily. 02/17/20  Yes Milagros Loll, MD  doxycycline (VIBRAMYCIN) 100 MG capsule Take 1 capsule (100 mg total) by mouth 2 (two) times daily for 7 days. 02/17/20 02/24/20 Yes Shay Jhaveri, Quitman Livings, MD  acetaminophen (TYLENOL) 325 MG tablet Take by mouth every 6 (six) hours as needed. Patient used this medication for his headache.    [provider]  albuterol (PROVENTIL HFA;VENTOLIN HFA) 108 (90 BASE) MCG/ACT inhaler Inhale 2 puffs into the lungs every 6 (six) hours as needed. Patient uses this medication for shortness of breath.    [provider]  albuterol  (PROVENTIL) (2.5 MG/3ML) 0.083% nebulizer solution Take 2.5 mg by nebulization every 6 (six) hours as needed.    [provider]  cephALEXin (KEFLEX) 500 MG capsule Take 1 capsule (500 mg total) by mouth 4 (four) times daily. 07/12/17   Law, Waylan Boga, PA-C  naproxen (NAPROSYN) 500 MG tablet Take 1 tablet (500 mg total) by mouth 2 (two) times daily. 07/12/17   Law, Waylan Boga, PA-C    Allergies    Peanut butter flavor and Soy allergy  Review of Systems   Review of Systems  Constitutional: Negative for chills and fever.  HENT: Negative for ear pain and sore throat.   Eyes: Negative for pain and visual disturbance.  Respiratory: Negative for cough and shortness of breath.   Cardiovascular: Negative for chest pain and palpitations.  Gastrointestinal: Negative for abdominal pain and vomiting.  Genitourinary: Positive for penile discharge and urgency. Negative for dysuria and hematuria.  Musculoskeletal: Negative for arthralgias and back pain.  Skin: Negative for color change and rash.  Neurological: Negative for seizures and syncope.  All other systems reviewed and are negative.   Physical Exam Updated Vital Signs BP (!) 142/98 (BP Location: Left Arm)   Pulse 79   Temp 98.4 F (36.9 C) (Oral)   Resp 18   Ht 5' 10.5" (1.791 m)   Wt 134 kg   SpO2 100%  BMI 41.79 kg/m   Physical Exam Vitals and nursing note reviewed. Exam conducted with a chaperone present.  Constitutional:      Appearance: He is well-developed and well-nourished.  HENT:     Head: Normocephalic and atraumatic.  Eyes:     Conjunctiva/sclera: Conjunctivae normal.  Cardiovascular:     Rate and Rhythm: Normal rate and regular rhythm.     Heart sounds: No murmur heard.   Pulmonary:     Effort: Pulmonary effort is normal. No respiratory distress.  Genitourinary:    Comments: Curtis RN chaperone  Normal-appearing testicles, penis, no discharge at tip of penis, testicles are nontender, no ulcers noted,  no inguinal lymphadenopathy Musculoskeletal:        General: No edema.     Cervical back: Neck supple.  Skin:    General: Skin is warm and dry.  Neurological:     Mental Status: He is alert.  Psychiatric:        Mood and Affect: Mood and affect and mood normal.        Behavior: Behavior normal.     ED Results / Procedures / Treatments   Labs (all labs ordered are listed, but only abnormal results are displayed) Labs Reviewed  URINALYSIS, ROUTINE W REFLEX MICROSCOPIC - Abnormal; Notable for the following components:      Result Value   APPearance CLOUDY (*)    Hgb urine dipstick MODERATE (*)    Leukocytes,Ua MODERATE (*)    All other components within normal limits  URINALYSIS, MICROSCOPIC (REFLEX) - Abnormal; Notable for the following components:   Bacteria, UA FEW (*)    All other components within normal limits  RPR  HIV ANTIBODY (ROUTINE TESTING W REFLEX)  GC/CHLAMYDIA PROBE AMP ([place]) NOT AT [facility]    EKG None  Radiology No results found.  Procedures Procedures   Medications Ordered in ED Medications  cefTRIAXone (ROCEPHIN) injection 500 mg (500 mg Intramuscular Given 02/17/20 1447)  lidocaine (PF) (XYLOCAINE) 1 % injection 1 mL (1 mL Other Given 02/17/20 1449)  metroNIDAZOLE (FLAGYL) tablet 2,000 mg (2,000 mg Oral Given 02/17/20 1451)    ED Course  I have reviewed the triage vital signs and the nursing notes.  Pertinent labs & imaging results that were available during my care of the patient were reviewed by me and considered in my medical decision making (see chart for details).    MDM Rules/Calculators/A&P                         25 year old male presenting to ER with concern for dysuria, small discharge and STD exposure.  UA with possible UTI.  Will treat empirically for his trichomonas exposure as well as possible gonorrhea and chlamydia.  Will additionally treat for possible UTI.  Patient provided Rocephin and trichomonas in ER.  Discharged with  cephalexin and doxycycline.  Will additionally send syphilis and STD testing.  If positive, patient will need notification and further instructions regarding treatment.  After the discussed management above, the patient was determined to be safe for discharge.  The patient was in agreement with this plan and all questions regarding their care were answered.  ED return precautions were discussed and the patient will return to the ED with any significant worsening of condition.    Final Clinical Impression(s) / ED Diagnoses Final diagnoses:  STD exposure  Dysuria    Rx / DC Orders ED Discharge Orders  Ordered    cephALEXin (KEFLEX) 500 MG capsule  4 times daily        02/17/20 1520    doxycycline (VIBRAMYCIN) 100 MG capsule  2 times daily        02/17/20 1520           Milagros Loll, MD 02/18/20 1200

## 2020-02-17 NOTE — Discharge Instructions (Addendum)
Please take the antibiotics as prescribed.  Recommend following safe sex practices which includes limiting partners and using protection.  Please check MyChart for your results.  Please follow-up with your primary care doctor.  If you test positive for syphilis there is another treatment that you will need in addition to what you have already received.

## 2020-02-18 LAB — RPR: RPR Ser Ql: NONREACTIVE

## 2020-02-20 LAB — GC/CHLAMYDIA PROBE AMP (~~LOC~~) NOT AT ARMC
Chlamydia: POSITIVE — AB
Comment: NEGATIVE
Comment: NORMAL
Neisseria Gonorrhea: POSITIVE — AB
